# Patient Record
Sex: Male | Born: 1980 | Race: White | Hispanic: No | Marital: Married | Smoking: Never smoker
Health system: Southern US, Community
[De-identification: ages and names within clinical notes are randomized; demographics above are authoritative.]

---

## 2004-02-01 ENCOUNTER — Emergency Department (HOSPITAL_COMMUNITY): Admission: EM | Admit: 2004-02-01 | Discharge: 2004-02-01 | Payer: Self-pay | Admitting: Family Medicine

## 2005-02-02 ENCOUNTER — Emergency Department (HOSPITAL_COMMUNITY): Admission: EM | Admit: 2005-02-02 | Discharge: 2005-02-02 | Payer: Self-pay | Admitting: Family Medicine

## 2010-02-27 ENCOUNTER — Emergency Department (HOSPITAL_COMMUNITY): Admission: EM | Admit: 2010-02-27 | Discharge: 2010-02-27 | Payer: Self-pay | Admitting: Emergency Medicine

## 2015-05-25 ENCOUNTER — Emergency Department (HOSPITAL_COMMUNITY): Payer: Self-pay

## 2015-05-25 ENCOUNTER — Encounter (HOSPITAL_COMMUNITY): Payer: Self-pay | Admitting: Emergency Medicine

## 2015-05-25 ENCOUNTER — Emergency Department (HOSPITAL_COMMUNITY)
Admission: EM | Admit: 2015-05-25 | Discharge: 2015-05-25 | Disposition: A | Discharge status: Home / Self Care | Payer: Self-pay | Attending: Emergency Medicine | Admitting: Emergency Medicine

## 2015-05-25 DIAGNOSIS — Y998 Other external cause status: Secondary | ICD-10-CM | POA: Insufficient documentation

## 2015-05-25 DIAGNOSIS — Y9389 Activity, other specified: Secondary | ICD-10-CM | POA: Insufficient documentation

## 2015-05-25 DIAGNOSIS — M545 Low back pain, unspecified: Secondary | ICD-10-CM

## 2015-05-25 DIAGNOSIS — Y93F2 Activity, caregiving, lifting: Secondary | ICD-10-CM | POA: Insufficient documentation

## 2015-05-25 DIAGNOSIS — X58XXXA Exposure to other specified factors, initial encounter: Secondary | ICD-10-CM | POA: Insufficient documentation

## 2015-05-25 DIAGNOSIS — S3992XA Unspecified injury of lower back, initial encounter: Secondary | ICD-10-CM | POA: Insufficient documentation

## 2015-05-25 DIAGNOSIS — Y9289 Other specified places as the place of occurrence of the external cause: Secondary | ICD-10-CM | POA: Insufficient documentation

## 2015-05-25 MED ORDER — CYCLOBENZAPRINE HCL 10 MG PO TABS: 10.0000 mg | ORAL_TABLET | Freq: Two times a day (BID) | ORAL | Status: DC | PRN

## 2015-05-25 MED ORDER — OXYCODONE-ACETAMINOPHEN 5-325 MG PO TABS
1.0000 | ORAL_TABLET | Freq: Once | ORAL | Status: AC
  Administered 2015-05-25: 1 via ORAL
  Filled 2015-05-25: qty 1

## 2015-05-25 NOTE — ED Provider Notes (Signed)
CSN: [id number]     Arrival date & time 05/25/15  1130 History  This chart was scribed for Scott Mechanic, PA-C working with Donnetta Hutching, MD by Evon Slack, ED Scribe. This patient was seen in room TR11C/TR11C and the patient's care was started at 1:06 PM.    Chief Complaint  Patient presents with  . Back Pain   The history is provided by the patient. No language interpreter was used.   HPI Comments: Scott Lutz is a 34 y.o. male who presents to the Emergency Department complaining of low back pain onset 1 day prior. Pt states that he was lifting a refrigerator when he injured his back yesterday. Pt states that when he was lifting the fridge he heard a pop in the back and felt immediate pain. Pt states that the pain is worse with movement or standing. Pt denies fever, bowel/bladder incontinence, numbness, weakness. Pt reports previous back injury several years ago, he states he noticed relief with a muscle relaxer.   History reviewed. No pertinent past medical history. History reviewed. No pertinent past surgical history. No family history on file. Social History  Substance Use Topics  . Smoking status: Never Smoker   . Smokeless tobacco: None  . Alcohol Use: Yes     Comment: socially    Review of Systems  All other systems reviewed and are negative.    Allergies  Review of patient's allergies indicates no known allergies.  Home Medications   Prior to Admission medications   Medication Sig Start Date End Date Taking? Authorizing Provider  cyclobenzaprine (FLEXERIL) 10 MG tablet Take 1 tablet (10 mg total) by mouth 2 (two) times daily as needed for muscle spasms. 05/25/15   Dustine Stickler, PA-C   BP 120/90 mmHg  Pulse 64  Temp(Src) 98.2 F (36.8 C) (Oral)  Resp 16  SpO2 100%   Physical Exam  Constitutional: He is oriented to person, place, and time. He appears well-developed and well-nourished. No distress.  HENT:  Head: Normocephalic and atraumatic.  Eyes:  Conjunctivae and EOM are normal.  Neck: Neck supple. No tracheal deviation present.  Cardiovascular: Normal rate.   Pulmonary/Chest: Effort normal. No respiratory distress.  Musculoskeletal: Normal range of motion. He exhibits tenderness.  No C,T or L spine tenderness, tenderness to right para lumbar soft tissue, no obcious deformity, signs of infection, trauma or swelling. Distal extremities strength 5/5.  sensation grossly intact. cap refill less than 3 seconds.   Neurological: He is alert and oriented to person, place, and time.  Skin: Skin is warm and dry.  Psychiatric: He has a normal mood and affect. His behavior is normal.  Nursing note and vitals reviewed.   ED Course  Procedures (including critical care time)  Labs Review Labs Reviewed - No data to display  Imaging Review Dg Lumbar Spine 2-3 Views  05/25/2015   CLINICAL DATA:  Sudden sharp pains in the low back with movement. Pain began while moving a refrigerator yesterday. Initial encounter.  EXAM: LUMBAR SPINE - 2-3 VIEW  COMPARISON:  None.  FINDINGS: Five non rib-bearing lumbar type vertebral bodies are present. Vertebral body heights and alignment are maintained. There is some straightening of the normal lumbar lordosis. Soft tissues are unremarkable.  IMPRESSION: 1. No acute fracture or traumatic subluxation. 2. Straightening of the normal lumbar lordosis. This is nonspecific, but commonly seen in the setting of muscle strain or ongoing pain.   Electronically Signed   By: Marin Roberts M.D.   On:  05/25/2015 14:00   I have personally reviewed and evaluated these images and lab results as part of my medical decision-making.   EKG Interpretation None      MDM   Final diagnoses:  Right-sided low back pain without sciatica  Labs:  Imaging: DG Lumbar Spine - no significant findings  Consults:   Therapeutics: Percocet  Discharge Meds: Flexeril  Assessment/Plan: Patient presents with back pain, no red flags.  Plain films show no acute findings. Patient given Flexeril, symptomatic care instructions. Patient instructed to follow up with primary care if symptoms continue to persist, return immediately if any new worsening signs or symptoms present. Patient verbalized understanding and agreement for today's plan       I personally performed the services described in this documentation, which was scribed in my presence. The recorded information has been reviewed and is accurate.     Scott Mechanic, PA-C 05/25/15 1951  Donnetta Hutching, MD 05/26/15 [phone]

## 2015-05-25 NOTE — Discharge Instructions (Signed)
Please use medication only as directed. Please use ice, ibuprofen, Tylenol as needed for pain. Return immediately if new or worsening signs or symptoms present.

## 2015-05-25 NOTE — ED Notes (Signed)
Patient returned from xray.

## 2015-05-25 NOTE — ED Notes (Signed)
Patient states was lifting a refrigerator yesterday and felt a pop in lower back.  Patient denies loss of continence or symptoms other than lower back pain with radiation to R leg.

## 2015-11-19 ENCOUNTER — Emergency Department (HOSPITAL_COMMUNITY): Payer: Self-pay

## 2015-11-19 ENCOUNTER — Encounter (HOSPITAL_COMMUNITY): Payer: Self-pay | Admitting: Emergency Medicine

## 2015-11-19 ENCOUNTER — Emergency Department (HOSPITAL_COMMUNITY)
Admission: EM | Admit: 2015-11-19 | Discharge: 2015-11-20 | Disposition: A | Discharge status: Home / Self Care | Payer: Self-pay | Attending: Emergency Medicine | Admitting: Emergency Medicine

## 2015-11-19 DIAGNOSIS — Y998 Other external cause status: Secondary | ICD-10-CM | POA: Insufficient documentation

## 2015-11-19 DIAGNOSIS — Y9289 Other specified places as the place of occurrence of the external cause: Secondary | ICD-10-CM | POA: Insufficient documentation

## 2015-11-19 DIAGNOSIS — Y9389 Activity, other specified: Secondary | ICD-10-CM | POA: Insufficient documentation

## 2015-11-19 DIAGNOSIS — S060X1A Concussion with loss of consciousness of 30 minutes or less, initial encounter: Secondary | ICD-10-CM | POA: Insufficient documentation

## 2015-11-19 DIAGNOSIS — S0083XA Contusion of other part of head, initial encounter: Secondary | ICD-10-CM | POA: Insufficient documentation

## 2015-11-19 LAB — CBC
HEMATOCRIT: 48.1 % (ref 39.0–52.0)
HEMOGLOBIN: 16.3 g/dL (ref 13.0–17.0)
MCH: 30.7 pg (ref 26.0–34.0)
MCHC: 33.9 g/dL (ref 30.0–36.0)
MCV: 90.6 fL (ref 78.0–100.0)
Platelets: 296 10*3/uL (ref 150–400)
RBC: 5.31 MIL/uL (ref 4.22–5.81)
RDW: 13.4 % (ref 11.5–15.5)
WBC: 12.1 10*3/uL — AB (ref 4.0–10.5)

## 2015-11-19 LAB — COMPREHENSIVE METABOLIC PANEL
ALBUMIN: 4.3 g/dL (ref 3.5–5.0)
ALK PHOS: 47 U/L (ref 38–126)
ALT: 64 U/L — AB (ref 17–63)
AST: 43 U/L — ABNORMAL HIGH (ref 15–41)
Anion gap: 15 (ref 5–15)
BILIRUBIN TOTAL: 0.5 mg/dL (ref 0.3–1.2)
BUN: 8 mg/dL (ref 6–20)
CO2: 21 mmol/L — ABNORMAL LOW (ref 22–32)
Calcium: 9.4 mg/dL (ref 8.9–10.3)
Chloride: 105 mmol/L (ref 101–111)
Creatinine, Ser: 1.02 mg/dL (ref 0.61–1.24)
GFR calc non Af Amer: >60 mL/min (ref >60)
Glucose, Bld: 120 mg/dL — ABNORMAL HIGH (ref 65–99)
Potassium: 3.9 mmol/L (ref 3.5–5.1)
SODIUM: 141 mmol/L (ref 135–145)
TOTAL PROTEIN: 7.9 g/dL (ref 6.5–8.1)

## 2015-11-19 LAB — CDS SEROLOGY

## 2015-11-19 LAB — ETHANOL: Alcohol, Ethyl (B): 264 mg/dL — ABNORMAL HIGH (ref <5)

## 2015-11-19 LAB — PROTIME-INR
INR: 1 (ref 0.00–1.49)
Prothrombin Time: 13.4 seconds (ref 11.6–15.2)

## 2015-11-19 LAB — SAMPLE TO BLOOD BANK

## 2015-11-19 MED ORDER — SODIUM CHLORIDE 0.9 % IV BOLUS (SEPSIS)
500.0000 mL | Freq: Once | INTRAVENOUS | Status: AC
  Administered 2015-11-19: 500 mL via INTRAVENOUS

## 2015-11-19 MED ORDER — SODIUM CHLORIDE 0.9 % IV BOLUS (SEPSIS)
1000.0000 mL | Freq: Once | INTRAVENOUS | Status: AC
  Administered 2015-11-20: 1000 mL via INTRAVENOUS
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
## 2015-11-20 NOTE — ED Notes (Signed)
Pt ambulated out of ED, discharge instructions given to father.
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
## 2015-11-20 NOTE — ED Notes (Signed)
Assisted patient in ambulating to bathroom and getting dressed. Waiting on mom to pick him up.

## 2015-11-20 NOTE — ED Notes (Signed)
No belongings left at bedside

## 2015-11-21 ENCOUNTER — Encounter (HOSPITAL_COMMUNITY): Payer: Self-pay | Admitting: Emergency Medicine

## 2016-07-07 ENCOUNTER — Emergency Department (HOSPITAL_COMMUNITY): Payer: Self-pay

## 2016-07-07 ENCOUNTER — Emergency Department (HOSPITAL_COMMUNITY)
Admission: EM | Admit: 2016-07-07 | Discharge: 2016-07-08 | Disposition: A | Discharge status: Home / Self Care | Payer: Self-pay | Attending: Emergency Medicine | Admitting: Emergency Medicine

## 2016-07-07 ENCOUNTER — Encounter (HOSPITAL_COMMUNITY): Payer: Self-pay

## 2016-07-07 DIAGNOSIS — S59292A Other physeal fracture of lower end of radius, left arm, initial encounter for closed fracture: Secondary | ICD-10-CM | POA: Insufficient documentation

## 2016-07-07 DIAGNOSIS — Y9339 Activity, other involving climbing, rappelling and jumping off: Secondary | ICD-10-CM | POA: Insufficient documentation

## 2016-07-07 DIAGNOSIS — Y999 Unspecified external cause status: Secondary | ICD-10-CM | POA: Insufficient documentation

## 2016-07-07 DIAGNOSIS — Y929 Unspecified place or not applicable: Secondary | ICD-10-CM | POA: Insufficient documentation

## 2016-07-07 DIAGNOSIS — Z5181 Encounter for therapeutic drug level monitoring: Secondary | ICD-10-CM | POA: Insufficient documentation

## 2016-07-07 DIAGNOSIS — F1414 Cocaine abuse with cocaine-induced mood disorder: Secondary | ICD-10-CM | POA: Diagnosis present

## 2016-07-07 DIAGNOSIS — R45851 Suicidal ideations: Secondary | ICD-10-CM

## 2016-07-07 DIAGNOSIS — X58XXXA Exposure to other specified factors, initial encounter: Secondary | ICD-10-CM | POA: Insufficient documentation

## 2016-07-07 DIAGNOSIS — F1014 Alcohol abuse with alcohol-induced mood disorder: Secondary | ICD-10-CM | POA: Insufficient documentation

## 2016-07-07 DIAGNOSIS — F1410 Cocaine abuse, uncomplicated: Secondary | ICD-10-CM | POA: Insufficient documentation

## 2016-07-07 DIAGNOSIS — S52502A Unspecified fracture of the lower end of left radius, initial encounter for closed fracture: Secondary | ICD-10-CM

## 2016-07-07 LAB — CBC
HEMATOCRIT: 48.7 % (ref 39.0–52.0)
Hemoglobin: 16.4 g/dL (ref 13.0–17.0)
MCH: 30.6 pg (ref 26.0–34.0)
MCHC: 33.7 g/dL (ref 30.0–36.0)
MCV: 90.9 fL (ref 78.0–100.0)
PLATELETS: 330 10*3/uL (ref 150–400)
RBC: 5.36 MIL/uL (ref 4.22–5.81)
RDW: 13.5 % (ref 11.5–15.5)
WBC: 9.3 10*3/uL (ref 4.0–10.5)

## 2016-07-07 LAB — COMPREHENSIVE METABOLIC PANEL
ALK PHOS: 49 U/L (ref 38–126)
ALT: 59 U/L (ref 17–63)
AST: 29 U/L (ref 15–41)
Albumin: 5 g/dL (ref 3.5–5.0)
Anion gap: 11 (ref 5–15)
BILIRUBIN TOTAL: 0.7 mg/dL (ref 0.3–1.2)
BUN: 14 mg/dL (ref 6–20)
CALCIUM: 9.4 mg/dL (ref 8.9–10.3)
CO2: 25 mmol/L (ref 22–32)
CREATININE: 0.82 mg/dL (ref 0.61–1.24)
Chloride: 105 mmol/L (ref 101–111)
GFR calc Af Amer: >60 mL/min (ref >60)
Glucose, Bld: 100 mg/dL — ABNORMAL HIGH (ref 65–99)
Potassium: 4 mmol/L (ref 3.5–5.1)
Sodium: 141 mmol/L (ref 135–145)
TOTAL PROTEIN: 8.7 g/dL — AB (ref 6.5–8.1)

## 2016-07-07 LAB — RAPID URINE DRUG SCREEN, HOSP PERFORMED
AMPHETAMINES: NOT DETECTED
BARBITURATES: NOT DETECTED
Benzodiazepines: NOT DETECTED
Cocaine: POSITIVE — AB
Opiates: NOT DETECTED
Tetrahydrocannabinol: POSITIVE — AB

## 2016-07-07 LAB — ETHANOL: ALCOHOL ETHYL (B): 114 mg/dL — AB (ref <5)

## 2016-07-07 LAB — SALICYLATE LEVEL: Salicylate Lvl: <4 mg/dL (ref 2.8–30.0)

## 2016-07-07 LAB — ACETAMINOPHEN LEVEL: Acetaminophen (Tylenol), Serum: <10 ug/mL — ABNORMAL LOW (ref 10–30)

## 2016-07-07 MED ORDER — THIAMINE HCL 100 MG/ML IJ SOLN: 100.0000 mg | Freq: Every day | INTRAMUSCULAR | Status: DC

## 2016-07-07 MED ORDER — VITAMIN B-1 100 MG PO TABS
100.0000 mg | ORAL_TABLET | Freq: Every day | ORAL | Status: DC
  Administered 2016-07-07 – 2016-07-08 (×2): 100 mg via ORAL
  Filled 2016-07-07 (×2): qty 1

## 2016-07-07 MED ORDER — IBUPROFEN 800 MG PO TABS
800.0000 mg | ORAL_TABLET | Freq: Once | ORAL | Status: AC
  Administered 2016-07-07: 800 mg via ORAL
  Filled 2016-07-07: qty 1

## 2016-07-07 MED ORDER — ZOLPIDEM TARTRATE 5 MG PO TABS: 5.0000 mg | ORAL_TABLET | Freq: Every evening | ORAL | Status: DC | PRN

## 2016-07-07 MED ORDER — LORAZEPAM 1 MG PO TABS
0.0000 mg | ORAL_TABLET | Freq: Four times a day (QID) | ORAL | Status: DC
  Administered 2016-07-07: 1 mg via ORAL
  Filled 2016-07-07 (×2): qty 1

## 2016-07-07 MED ORDER — LORAZEPAM 1 MG PO TABS: 0.0000 mg | ORAL_TABLET | Freq: Two times a day (BID) | ORAL | Status: DC

## 2016-07-07 MED ORDER — ONDANSETRON HCL 4 MG PO TABS: 4.0000 mg | ORAL_TABLET | Freq: Three times a day (TID) | ORAL | Status: DC | PRN

## 2016-07-07 MED ORDER — IBUPROFEN 200 MG PO TABS
600.0000 mg | ORAL_TABLET | Freq: Three times a day (TID) | ORAL | Status: DC | PRN
  Administered 2016-07-07 – 2016-07-08 (×2): 600 mg via ORAL
  Filled 2016-07-07 (×2): qty 3

## 2016-07-07 NOTE — ED Notes (Signed)
Report given to [facility]
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
## 2016-07-07 NOTE — ED Notes (Signed)
Pt provided with scrubs by NT Tresa EndoKelly.

## 2016-07-07 NOTE — BH Assessment (Addendum)
Assessment Note  Scott Lutz is an 35 y.o. male presenting to [facility] voluntarily for suicidal ideations. Patient states that he thought that his arm was broken after a police chase last night. Patient states that he had a DUI and ran from the police and jumped a fence and hurt his arm. Patient states that he was released from jail and states "I just wanted to kill myself and I told the officer if they let me go I was just going to walk in front of a car or something." Patient states that police brought him into the ED for an evaluation and for a physical examination of his arm because he thought that it was broken. . Patient denies SI at this time but states that he has had recurring fleeting thoughts lately. Patient denies intent when asked but states "I would hang myself" as a plan. Patient denies that he has attempted in the past and that he has made any gestures towards this attempt. Patient states that he is not sure what he would use to hang himself but states " I just feel like it would be better iff I wasn't here." Patient states that his most recent stressor are multiple felony/misdemeanor charges for which he has court on July 10, 2016, in addition to his DUI. He states that due to the charges he lost his job, his car, and my lose his home. He states that his girlfriend also lost her job and there is no income for the home. Patient denies HI and history of aggression. Patient denies access to firearms. Patient denies AVH and does not appear to be responding to internal stimuli during the assessment. Patient states that he uses alcohol "about every other day" and last drank 8-9 drinks last night. Patient states that he uses about one gram of cocaine at "random" and last used .5 gram last night. Patient UDS +cocaine and + THC. Patient BAL 114.  Patient is alert and oriented x4 and is calm and cooperative during the assessment. Patient denies history of inpatient or outpatient psychiatric treatment.  Patient states that he has not been prescribed medication for depression or other psychiatric illness. Patient denies history of trauma/abuse.   Consulted with Nanine Means, DNP who recommends observation overnight and evaluation by psychiatry in the morning.    Diagnosis:  Cocaine use disorder, Severe                         Unspecified Depressive Disorder   Past Medical History: History reviewed. No pertinent past medical history.  History reviewed. No pertinent surgical history.  Family History: No family history on file.  Social History:  reports that he has never smoked. His smokeless tobacco use includes Chew. He reports that he drinks alcohol. He reports that he does not use drugs.  Additional Social History:  Alcohol / Drug Use Pain Medications: Denies Prescriptions: Denies Over the Counter: Denies History of alcohol / drug use?: Yes Substance #1 Name of Substance 1: Alcohol 1 - Age of First Use: 20 1 - Amount (size/oz): "about six a day" 1 - Frequency: "every other day" 1 - Duration: ongoing 1 - Last Use / Amount: last night 8-9 drinks Substance #2 Name of Substance 2: Cocaine 2 - Age of First Use: 26 2 - Amount (size/oz): a gram  2 - Frequency: "just random" 2 - Duration: ongoing 2 - Last Use / Amount: .5 gram  CIWA: CIWA-Ar BP: 121/75 Pulse Rate:  86 Nausea and Vomiting: no nausea and no vomiting Tactile Disturbances: none Tremor: two Auditory Disturbances: not present Paroxysmal Sweats: no sweat visible Visual Disturbances: not present Anxiety: three Headache, Fullness in Head: moderate Agitation: somewhat more than normal activity Orientation and Clouding of Sensorium: oriented and can do serial additions CIWA-Ar Total: 9 COWS:    Allergies: No Known Allergies  Home Medications:  (Not in a hospital admission)  OB/GYN Status:  No LMP for male patient.  General Assessment Data Location of Assessment: [facility] TTS Assessment: In system Is this a Tele  or Face-to-Face Assessment?: Face-to-Face Is this an Initial Assessment or a Re-assessment for this encounter?: Initial Assessment Marital status: Separated (two years) Is patient pregnant?: No Pregnancy Status: No Living Arrangements: Alone Can pt return to current living arrangement?: Yes Admission Status: Voluntary Is patient capable of signing voluntary admission?: Yes Referral Source: Self/Family/Friend     Crisis Care Plan Living Arrangements: Alone Name of Psychiatrist: None Name of Therapist: None  Education Status Is patient currently in school?: No Highest grade of school patient has completed: 12  Risk to self with the past 6 months Suicidal Ideation: Yes-Currently Present Has patient been a risk to self within the past 6 months prior to admission? : Yes Suicidal Intent: No Has patient had any suicidal intent within the past 6 months prior to admission? : No Is patient at risk for suicide?: Yes Suicidal Plan?: Yes-Currently Present Has patient had any suicidal plan within the past 6 months prior to admission? : No Specify Current Suicidal Plan: " I would hang mysel;f" Access to Means: Yes Specify Access to Suicidal Means: "i'm not sure" What has been your use of drugs/alcohol within the last 12 months?: Alcohol and cocaine Previous Attempts/Gestures: No How many times?: 0 Other Self Harm Risks: Denies Triggers for Past Attempts: None known Intentional Self Injurious Behavior: None Family Suicide History: No Recent stressful life event(s): Other (Comment) (girlfriend lost her job, Surveyor, quantity strain, lost car, house) Persecutory voices/beliefs?: No Depression: Yes Depression Symptoms: Insomnia, Tearfulness, Isolating, Fatigue, Loss of interest in usual pleasures, Feeling worthless/self pity, Feeling angry/irritable Substance abuse history and/or treatment for substance abuse?: Yes Suicide prevention information given to non-admitted patients: Not  applicable  Risk to Others within the past 6 months Homicidal Ideation: No Does patient have any lifetime risk of violence toward others beyond the six months prior to admission? : No Thoughts of Harm to Others: No Current Homicidal Intent: No Current Homicidal Plan: No Access to Homicidal Means: No Identified Victim: Denies History of harm to others?: No Assessment of Violence: None Noted Violent Behavior Description: Denies Does patient have access to weapons?: No Criminal Charges Pending?: Yes Describe Pending Criminal Charges: Assault, DWI, fleeing and alluding  Does patient have a court date: Yes Court Date: 07/10/16 (and 10/30) Is patient on probation?: No  Psychosis Hallucinations: None noted Delusions: None noted  Mental Status Report Appearance/Hygiene: In scrubs Eye Contact: Fair Motor Activity: Unremarkable Speech: Logical/coherent Level of Consciousness: Alert Mood: Pleasant Affect: Appropriate to circumstance Anxiety Level: None Thought Processes: Coherent, Relevant Judgement: Partial Orientation: Person, Place, Time, Situation, Appropriate for developmental age Obsessive Compulsive Thoughts/Behaviors: None  Cognitive Functioning Concentration: Normal Memory: Recent Intact, Remote Intact IQ: Average Insight: Fair Impulse Control: Fair Appetite: Fair Weight Loss: 10 (in past 30 days) Sleep: Decreased Total Hours of Sleep:  (4-5) Vegetative Symptoms: Not bathing  ADLScreening [facility] Assessment Services) Patient's cognitive ability adequate to safely complete daily activities?: Yes Patient able to express  need for assistance with ADLs?: Yes Independently performs ADLs?: Yes (appropriate for developmental age)  Prior Inpatient Therapy Prior Inpatient Therapy: No Prior Therapy Dates: N/A Prior Therapy Facilty/Provider(s): N/A Reason for Treatment: N/A  Prior Outpatient Therapy Prior Outpatient Therapy: No Prior Therapy Dates: N/A Prior Therapy  Facilty/Provider(s): N/A Reason for Treatment: N/A Does patient have an ACCT team?: No Does patient have Intensive In-House Services?  : No Does patient have [facility] services? : No Does patient have P4CC services?: No  ADL Screening (condition at time of admission) Patient's cognitive ability adequate to safely complete daily activities?: Yes Is the patient deaf or have difficulty hearing?: No Does the patient have difficulty seeing, even when wearing glasses/contacts?: No Does the patient have difficulty concentrating, remembering, or making decisions?: No Patient able to express need for assistance with ADLs?: Yes Does the patient have difficulty dressing or bathing?: No Independently performs ADLs?: Yes (appropriate for developmental age) Does the patient have difficulty walking or climbing stairs?: No Weakness of Legs: None Weakness of Arms/Hands: None  Home Assistive Devices/Equipment Home Assistive Devices/Equipment: None    Abuse/Neglect Assessment (Assessment to be complete while patient is alone) Physical Abuse: Denies Verbal Abuse: Denies Sexual Abuse: Denies Exploitation of patient/patient's resources: Denies Self-Neglect: Denies Values / Beliefs Cultural Requests During Hospitalization: None Spiritual Requests During Hospitalization: None   Advance Directives (For Healthcare) Does patient have an advance directive?: No Would patient like information on creating an advanced directive?: No - patient declined information    Additional Information 1:1 In Past 12 Months?: No CIRT Risk: No Elopement Risk: No Does patient have medical clearance?: Yes     Disposition:  Disposition Initial Assessment Completed for this Encounter: Yes Disposition of Patient: Other dispositions (observe overbight per Nanine MeansJamison Lord, DNP) Other disposition(s): Other (Comment)  On Site Evaluation by:   Reviewed with Physician:    Sahaj Bona 07/07/2016 12:51 PM

## 2016-07-07 NOTE — ED Notes (Addendum)
Pt changed into scrubs. Belongings and pt wanded by security

## 2016-07-07 NOTE — ED Provider Notes (Signed)
[facility] Provider Note   CSN: [id number] Arrival date & time: 07/07/16  [id number]     History   Chief Complaint Chief Complaint  Patient presents with  . Suicidal  . Arm Injury    HPI Scott Lutz is a 35 y.o. male.  HPI  Pt presenting with c/o feeling suicidal with plan to hang himself.  Pt states that it was his birthday yesterday and he did not get a call from anyone- he states he began to drink alcohol.  At one point he was running from police and climbed over a fence injuring his right arm/wrist.  He states he thought about running his car into something to kill himself but "chickened out".  Now he states he would hang himself.  Denies other drug use besides drinking etoh.  Denies recent illness.  There are no other associated systemic symptoms, there are no other alleviating or modifying factors.   History reviewed. No pertinent past medical history.  There are no active problems to display for this patient.   History reviewed. No pertinent surgical history.     Home Medications    Prior to Admission medications   Not on File    Family History No family history on file.  Social History Social History  Substance Use Topics  . Smoking status: Never Smoker  . Smokeless tobacco: Current User    Types: Chew  . Alcohol use Yes     Comment: socially     Allergies   Review of patient's allergies indicates no known allergies.   Review of Systems Review of Systems  ROS reviewed and all otherwise negative except for mentioned in HPI   Physical Exam Updated Vital Signs BP 121/75   Pulse 86   Temp 98.5 F (36.9 C)   Resp 20   Ht 5\' 9"  (1.753 m)   Wt 190 lb (86.2 kg)   SpO2 97%   BMI 28.06 kg/m  Vitals reviewed Physical Exam Physical Examination: General appearance - alert, well appearing, and in no distress Mental status - alert, oriented to person, place, and time Eyes - no conjunctival injection no scleral icterus Chest - clear to  auscultation, no wheezes, rales or rhonchi, symmetric air entry Heart - normal rate, regular rhythm, normal S1, S2, no murmurs, rubs, clicks or gallops Abdomen - soft, nontender, nondistended, no masses or organomegaly Back exam - full range of motion, no tenderness Neurological - alert, oriented, normal speech Musculoskeletal -left wrist with ttp over dorsum of wrist, distally NVI, otherwise  no joint tenderness, deformity or swelling Extremities - peripheral pulses normal, no pedal edema, no clubbing or cyanosis Skin - normal coloration and turgor, no rashes Psych- tearful, but cooperative  ED Treatments / Results  Labs (all labs ordered are listed, but only abnormal results are displayed) Labs Reviewed  COMPREHENSIVE METABOLIC PANEL - Abnormal; Notable for the following:       Result Value   Glucose, Bld 100 (*)    Total Protein 8.7 (*)    All other components within normal limits  ETHANOL - Abnormal; Notable for the following:    Alcohol, Ethyl (B) 114 (*)    All other components within normal limits  ACETAMINOPHEN LEVEL - Abnormal; Notable for the following:    Acetaminophen (Tylenol), Serum <10 (*)    All other components within normal limits  URINE RAPID DRUG SCREEN, HOSP PERFORMED - Abnormal; Notable for the following:    Cocaine POSITIVE (*)    Tetrahydrocannabinol POSITIVE (*)  All other components within normal limits  SALICYLATE LEVEL  CBC    EKG  EKG Interpretation None       Radiology Dg Wrist Complete Left  Result Date: 07/07/2016 CLINICAL DATA:  Pain after trauma. EXAM: LEFT WRIST - COMPLETE 3+ VIEW COMPARISON:  None. FINDINGS: There is a nondisplaced fracture through the distal radial metaphysis. No other abnormalities. IMPRESSION: Nondisplaced fracture through the distal radial metaphysis. Electronically Signed   By: Gerome Sam III M.D   On: 07/07/2016 08:26    Procedures Procedures (including critical care time)  Medications Ordered in  ED Medications  ibuprofen (ADVIL,MOTRIN) tablet 600 mg (not administered)  zolpidem (AMBIEN) tablet 5 mg (not administered)  ondansetron (ZOFRAN) tablet 4 mg (not administered)  LORazepam (ATIVAN) tablet 0-4 mg (0 mg Oral Hold 07/07/16 1220)    Followed by  LORazepam (ATIVAN) tablet 0-4 mg (not administered)  thiamine (VITAMIN B-1) tablet 100 mg (100 mg Oral Given 07/07/16 1132)    Or  thiamine (B-1) injection 100 mg ( Intravenous See Alternative 07/07/16 1132)  ibuprofen (ADVIL,MOTRIN) tablet 800 mg (800 mg Oral Given 07/07/16 0902)     Initial Impression / Assessment and Plan / ED Course  I have reviewed the triage vital signs and the nursing notes.  Pertinent labs & imaging results that were available during my care of the patient were reviewed by me and considered in my medical decision making (see chart for details).  Clinical Course  10:07 AM pt with nondisplaced distal radius fracture- this was splinted and will need ortho followup.  Pt is medically cleared at this time.    11:27 AM TTS has evaluated patient, they are going to keep patient overnight for observation.    Pt here with suicidal ideations- he does have a nondisplaced distal radius fracture- this is NVI- was placed in splint and given ibuprofen for pain control.  Pt will need to f/u with Dr. Merlyn Lot, hand surgery for casting.  Per TTS patient will be observed overnight.    Final Clinical Impressions(s) / ED Diagnoses   Final diagnoses:  Suicidal ideation  Distal radius fracture, left, closed, initial encounter    New Prescriptions New Prescriptions   No medications on file     Jerelyn Scott, MD 07/07/16 1621

## 2016-07-08 DIAGNOSIS — F1014 Alcohol abuse with alcohol-induced mood disorder: Secondary | ICD-10-CM | POA: Diagnosis present

## 2016-07-08 DIAGNOSIS — F17290 Nicotine dependence, other tobacco product, uncomplicated: Secondary | ICD-10-CM

## 2016-07-08 DIAGNOSIS — F1414 Cocaine abuse with cocaine-induced mood disorder: Secondary | ICD-10-CM | POA: Diagnosis present

## 2016-07-08 DIAGNOSIS — F1410 Cocaine abuse, uncomplicated: Secondary | ICD-10-CM | POA: Diagnosis present

## 2016-07-08 NOTE — ED Notes (Signed)
No respiratory or acute distress noted resting in bed with eyes closed hospital sitter with pt. 

## 2016-07-08 NOTE — ED Notes (Signed)
BP 144/106, ED provider notified. Discharge orders explained to pt, he verbalized understanding. Discharging home with mom.

## 2016-07-08 NOTE — ED Notes (Signed)
No respiratory or acute distress noted resting in bed with eyes closed hospital sitter at bedside.

## 2016-07-08 NOTE — Consult Note (Addendum)
[place] Psychiatry Consult   Reason for Consult:  Substance abuse with suicidal ideations Referring Physician:  EDP Patient Identification: Scott Lutz MRN:  [id number] Principal Diagnosis: Alcohol abuse with alcohol-induced mood disorder [facility]) Diagnosis:   Patient Active Problem List   Diagnosis Date Noted  . Alcohol abuse with alcohol-induced mood disorder ([place]) [F10.14] 07/08/2016    Priority: High  . Cocaine abuse [F14.10] 07/08/2016    Priority: High    Total Time spent with patient: 45 minutes  Subjective:   Scott Lutz is a 35 y.o. male patient does not warrant admission.  HPI:  35 yo male who presented to the ED with alcohol and cocaine abuse with suicidal ideations.  Last night he was drinking and using cocaine to celebrate to his birthday.  He was pulled over by the police and he ran.  In the process, he jumped a fence and hurt his arm.  Scott Lutz received a DWI and fleeing the scene charges.  Today, he is clear and coherent.  He is calmly watching television.  Denies suicidal/homicidal ideations, hallucinations, and withdrawal symptoms.  Stable for discharge with ADS resources.  Past Psychiatric History: alcohol and cocaine abuse  Risk to Self: Suicidal Ideation: Yes-Currently Present Suicidal Intent: No Is patient at risk for suicide?: Yes Suicidal Plan?: Yes-Currently Present Specify Current Suicidal Plan: " I would hang mysel;f" Access to Means: Yes Specify Access to Suicidal Means: "i'm not sure" What has been your use of drugs/alcohol within the last 12 months?: Alcohol and cocaine How many times?: 0 Other Self Harm Risks: Denies Triggers for Past Attempts: None known Intentional Self Injurious Behavior: None Risk to Others: Homicidal Ideation: No Thoughts of Harm to Others: No Current Homicidal Intent: No Current Homicidal Plan: No Access to Homicidal Means: No Identified Victim: Denies History of harm to others?: No Assessment of Violence:  None Noted Violent Behavior Description: Denies Does patient have access to weapons?: No Criminal Charges Pending?: Yes Describe Pending Criminal Charges: Assault, DWI, fleeing and alluding  Does patient have a court date: Yes Court Date: 07/10/16 (and 10/30) Prior Inpatient Therapy: Prior Inpatient Therapy: No Prior Therapy Dates: N/A Prior Therapy Facilty/Provider(s): N/A Reason for Treatment: N/A Prior Outpatient Therapy: Prior Outpatient Therapy: No Prior Therapy Dates: N/A Prior Therapy Facilty/Provider(s): N/A Reason for Treatment: N/A Does patient have an ACCT team?: No Does patient have Intensive In-House Services?  : No Does patient have [facility] services? : No Does patient have P4CC services?: No  Past Medical History: History reviewed. No pertinent past medical history. History reviewed. No pertinent surgical history. Family History: No family history on file. Family Psychiatric  History: none Social History:  History  Alcohol Use  . Yes    Comment: socially     History  Drug Use No    Social History   Social History  . Marital status: Married    Spouse name: N/A  . Number of children: N/A  . Years of education: N/A   Social History Main Topics  . Smoking status: Never Smoker  . Smokeless tobacco: Current User    Types: Chew  . Alcohol use Yes     Comment: socially  . Drug use: No  . Sexual activity: Not Asked   Other Topics Concern  . None   Social History Narrative   ** Merged History Encounter **       Additional Social History:    Allergies:  No Known Allergies  Labs:  Results for orders placed  or performed during the hospital encounter of 07/07/16 (from the past 48 hour(s))  Comprehensive metabolic panel     Status: Abnormal   Collection Time: 07/07/16  7:50 AM  Result Value Ref Range   Sodium 141 135 - 145 mmol/L   Potassium 4.0 3.5 - 5.1 mmol/L   Chloride 105 101 - 111 mmol/L   CO2 25 22 - 32 mmol/L   Glucose, Bld 100 (H) 65 - 99  mg/dL   BUN 14 6 - 20 mg/dL   Creatinine, Ser 0.82 0.61 - 1.24 mg/dL   Calcium 9.4 8.9 - 10.3 mg/dL   Total Protein 8.7 (H) 6.5 - 8.1 g/dL   Albumin 5.0 3.5 - 5.0 g/dL   AST 29 15 - 41 U/L   ALT 59 17 - 63 U/L   Alkaline Phosphatase 49 38 - 126 U/L   Total Bilirubin 0.7 0.3 - 1.2 mg/dL   GFR calc non Af Amer >60 >60 mL/min   GFR calc Af Amer >60 >60 mL/min    Comment: (NOTE) The eGFR has been calculated using the CKD EPI equation. This calculation has not been validated in all clinical situations. eGFR's persistently <60 mL/min signify possible Chronic Kidney Disease.    Anion gap 11 5 - 15  cbc     Status: None   Collection Time: 07/07/16  7:50 AM  Result Value Ref Range   WBC 9.3 4.0 - 10.5 K/uL   RBC 5.36 4.22 - 5.81 MIL/uL   Hemoglobin 16.4 13.0 - 17.0 g/dL   HCT 48.7 39.0 - 52.0 %   MCV 90.9 78.0 - 100.0 fL   MCH 30.6 26.0 - 34.0 pg   MCHC 33.7 30.0 - 36.0 g/dL   RDW 13.5 11.5 - 15.5 %   Platelets 330 150 - 400 K/uL  Rapid urine drug screen (hospital performed)     Status: Abnormal   Collection Time: 07/07/16  7:51 AM  Result Value Ref Range   Opiates NONE DETECTED NONE DETECTED   Cocaine POSITIVE (A) NONE DETECTED   Benzodiazepines NONE DETECTED NONE DETECTED   Amphetamines NONE DETECTED NONE DETECTED   Tetrahydrocannabinol POSITIVE (A) NONE DETECTED   Barbiturates NONE DETECTED NONE DETECTED    Comment:        DRUG SCREEN FOR MEDICAL PURPOSES ONLY.  IF CONFIRMATION IS NEEDED FOR ANY PURPOSE, NOTIFY LAB WITHIN 5 DAYS.        LOWEST DETECTABLE LIMITS FOR URINE DRUG SCREEN Drug Class       Cutoff (ng/mL) Amphetamine      1000 Barbiturate      200 Benzodiazepine   626 Tricyclics       948 Opiates          300 Cocaine          300 THC              50   Ethanol     Status: Abnormal   Collection Time: 07/07/16  7:52 AM  Result Value Ref Range   Alcohol, Ethyl (B) 114 (H) <5 mg/dL    Comment:        LOWEST DETECTABLE LIMIT FOR SERUM ALCOHOL IS 5  mg/dL FOR MEDICAL PURPOSES ONLY   Salicylate level     Status: None   Collection Time: 07/07/16  7:52 AM  Result Value Ref Range   Salicylate Lvl <5.4 2.8 - 30.0 mg/dL  Acetaminophen level     Status: Abnormal   Collection Time: 07/07/16  7:52  AM  Result Value Ref Range   Acetaminophen (Tylenol), Serum <10 (L) 10 - 30 ug/mL    Comment:        THERAPEUTIC CONCENTRATIONS VARY SIGNIFICANTLY. A RANGE OF 10-30 ug/mL MAY BE AN EFFECTIVE CONCENTRATION FOR MANY PATIENTS. HOWEVER, SOME ARE BEST TREATED AT CONCENTRATIONS OUTSIDE THIS RANGE. ACETAMINOPHEN CONCENTRATIONS >150 ug/mL AT 4 HOURS AFTER INGESTION AND >50 ug/mL AT 12 HOURS AFTER INGESTION ARE OFTEN ASSOCIATED WITH TOXIC REACTIONS.     Current Facility-Administered Medications  Medication Dose Route Frequency Provider Last Rate Last Dose  . ibuprofen (ADVIL,MOTRIN) tablet 600 mg  600 mg Oral Q8H PRN Alfonzo Beers, MD   600 mg at 07/08/16 0904  . LORazepam (ATIVAN) tablet 0-4 mg  0-4 mg Oral Q6H Alfonzo Beers, MD   1 mg at 07/07/16 1823   Followed by  . [START ON 07/09/2016] LORazepam (ATIVAN) tablet 0-4 mg  0-4 mg Oral Q12H Alfonzo Beers, MD      . ondansetron [facility]) tablet 4 mg  4 mg Oral Q8H PRN Alfonzo Beers, MD      . thiamine (VITAMIN B-1) tablet 100 mg  100 mg Oral Daily Alfonzo Beers, MD   100 mg at 07/08/16 7654   Or  . thiamine (B-1) injection 100 mg  100 mg Intravenous Daily Alfonzo Beers, MD      . zolpidem (AMBIEN) tablet 5 mg  5 mg Oral QHS PRN Alfonzo Beers, MD       No current outpatient prescriptions on file.    Musculoskeletal: Strength & Muscle Tone: within normal limits Gait & Station: normal Patient leans: N/A  Psychiatric Specialty Exam: Physical Exam  Constitutional: He is oriented to person, place, and time. He appears well-developed and well-nourished.  HENT:  Head: Normocephalic.  Neck: Normal range of motion.  Respiratory: Effort normal.  Musculoskeletal: Normal range of motion.   Neurological: He is alert and oriented to person, place, and time.  Skin: Skin is warm and dry.  Psychiatric: He has a normal mood and affect. His speech is normal and behavior is normal. Judgment and thought content normal. Cognition and memory are normal.    Review of Systems  Constitutional: Negative.   HENT: Negative.   Eyes: Negative.   Respiratory: Negative.   Cardiovascular: Negative.   Gastrointestinal: Negative.   Genitourinary: Negative.   Musculoskeletal: Negative.   Skin: Negative.   Neurological: Negative.   Endo/Heme/Allergies: Negative.   Psychiatric/Behavioral: Positive for substance abuse.    Blood pressure 126/78, pulse 75, temperature 97.8 F (36.6 C), temperature source Oral, resp. rate 18, height 5' 9"  (1.753 m), weight 86.2 kg (190 lb), SpO2 96 %.Body mass index is 28.06 kg/m.  General Appearance: Casual  Eye Contact:  Good  Speech:  Normal Rate  Volume:  Normal  Mood:  Euthymic  Affect:  Congruent  Thought Process:  Coherent and Descriptions of Associations: Intact  Orientation:  Full (Time, Place, and Person)  Thought Content:  WDL  Suicidal Thoughts:  No  Homicidal Thoughts:  No  Memory:  Immediate;   Good Recent;   Good Remote;   Good  Judgement:  Fair  Insight:  Fair  Psychomotor Activity:  Normal  Concentration:  Concentration: Good and Attention Span: Good  Recall:  Good  Fund of Knowledge:  Fair  Language:  Good  Akathisia:  No  Handed:  Right  AIMS (if indicated):     Assets:  Leisure Time Physical Health Resilience Social Support  ADL's:  Intact  Cognition:  WNL  Sleep:        Treatment Plan Summary: Daily contact with patient to assess and evaluate symptoms and progress in treatment, Medication management and Plan alcohol abuse with alcohol induced mood disorder:  -Crisis stabilization -Medication management:  Ativan alcohol detox protocol started. -Individual counseling  Disposition: No evidence of imminent risk to self  or others at present.    Scott Boga, NP 07/08/2016 10:17 AM   Patient seen face to face for this psych evaluation along with physician extender, case discussed with treatment team and formulated treatment plan. Patient denied current suicide or homicide ideation, and has normal mental status since become sober during this visit. Reviewed the information documented and agree with the treatment plan.  Scott Lutz 07/09/2016 11:42 AM

## 2016-07-08 NOTE — [facility] Suicide Risk Assessment (Signed)
Suicide Risk Assessment  Discharge Assessment   [facility]   Principal Problem: Alcohol abuse with alcohol-induced mood disorder [facility](HCC) Discharge Diagnoses:  Patient Active Problem List   Diagnosis Date Noted  . Alcohol abuse with alcohol-induced mood disorder (HCC) [F10.14] 07/08/2016    Priority: High  . Cocaine abuse [F14.10] 07/08/2016    Priority: High    Total Time spent with patient: 45 minutes  Musculoskeletal: Strength & Muscle Tone: within normal limits Gait & Station: normal Patient leans: N/A  Psychiatric Specialty Exam: Physical Exam  Constitutional: He is oriented to person, place, and time. He appears well-developed and well-nourished.  HENT:  Head: Normocephalic.  Neck: Normal range of motion.  Respiratory: Effort normal.  Musculoskeletal: Normal range of motion.  Neurological: He is alert and oriented to person, place, and time.  Skin: Skin is warm and dry.  Psychiatric: He has a normal mood and affect. His speech is normal and behavior is normal. Judgment and thought content normal. Cognition and memory are normal.    Review of Systems  Constitutional: Negative.   HENT: Negative.   Eyes: Negative.   Respiratory: Negative.   Cardiovascular: Negative.   Gastrointestinal: Negative.   Genitourinary: Negative.   Musculoskeletal: Negative.   Skin: Negative.   Neurological: Negative.   Endo/Heme/Allergies: Negative.   Psychiatric/Behavioral: Positive for substance abuse.    Blood pressure 126/78, pulse 75, temperature 97.8 F (36.6 C), temperature source Oral, resp. rate 18, height 5\' 9"  (1.753 m), weight 86.2 kg (190 lb), SpO2 96 %.Body mass index is 28.06 kg/m.  General Appearance: Casual  Eye Contact:  Good  Speech:  Normal Rate  Volume:  Normal  Mood:  Euthymic  Affect:  Congruent  Thought Process:  Coherent and Descriptions of Associations: Intact  Orientation:  Full (Time, Place, and Person)  Thought Content:  WDL   Suicidal Thoughts:  No  Homicidal Thoughts:  No  Memory:  Immediate;   Good Recent;   Good Remote;   Good  Judgement:  Fair  Insight:  Fair  Psychomotor Activity:  Normal  Concentration:  Concentration: Good and Attention Span: Good  Recall:  Good  Fund of Knowledge:  Fair  Language:  Good  Akathisia:  No  Handed:  Right  AIMS (if indicated):     Assets:  Leisure Time Physical Health Resilience Social Support  ADL's:  Intact  Cognition:  WNL  Sleep:      Mental Status Per Nursing Assessment::   On Admission:   alcohol and cocaine abuse with suicidal ideations  Demographic Factors:  Male and Caucasian  Loss Factors: Legal issues  Historical Factors: NA  Risk Reduction Factors:   Sense of responsibility to family, Living with another person, especially a relative and Positive social support  Continued Clinical Symptoms:  None   Cognitive Features That Contribute To Risk:  None    Suicide Risk:  Minimal: No identifiable suicidal ideation.  Patients presenting with no risk factors but with morbid ruminations; may be classified as minimal risk based on the severity of the depressive symptoms    Plan Of Care/Follow-up recommendations:  Activity:  as tolerated Diet:  heart healthy diet  Kadence Mimbs, NP 07/08/2016, 10:25 AM

## 2017-02-02 ENCOUNTER — Emergency Department (HOSPITAL_COMMUNITY): Payer: Self-pay

## 2017-02-02 ENCOUNTER — Encounter (HOSPITAL_COMMUNITY): Payer: Self-pay

## 2017-02-02 ENCOUNTER — Emergency Department (HOSPITAL_COMMUNITY)
Admission: EM | Admit: 2017-02-02 | Discharge: 2017-02-02 | Disposition: A | Discharge status: Home / Self Care | Payer: Self-pay | Attending: Emergency Medicine | Admitting: Emergency Medicine

## 2017-02-02 DIAGNOSIS — F10920 Alcohol use, unspecified with intoxication, uncomplicated: Secondary | ICD-10-CM

## 2017-02-02 DIAGNOSIS — Z5181 Encounter for therapeutic drug level monitoring: Secondary | ICD-10-CM | POA: Insufficient documentation

## 2017-02-02 DIAGNOSIS — R930 Abnormal findings on diagnostic imaging of skull and head, not elsewhere classified: Secondary | ICD-10-CM | POA: Insufficient documentation

## 2017-02-02 DIAGNOSIS — F10129 Alcohol abuse with intoxication, unspecified: Secondary | ICD-10-CM | POA: Insufficient documentation

## 2017-02-02 LAB — CBC WITH DIFFERENTIAL/PLATELET
Basophils Absolute: 0 10*3/uL (ref 0.0–0.1)
Basophils Relative: 0 %
EOS ABS: 0.3 10*3/uL (ref 0.0–0.7)
Eosinophils Relative: 3 %
HCT: 49.3 % (ref 39.0–52.0)
HEMOGLOBIN: 16.4 g/dL (ref 13.0–17.0)
LYMPHS ABS: 3.6 10*3/uL (ref 0.7–4.0)
Lymphocytes Relative: 34 %
MCH: 30.8 pg (ref 26.0–34.0)
MCHC: 33.3 g/dL (ref 30.0–36.0)
MCV: 92.7 fL (ref 78.0–100.0)
MONOS PCT: 6 %
Monocytes Absolute: 0.7 10*3/uL (ref 0.1–1.0)
Neutro Abs: 6.1 10*3/uL (ref 1.7–7.7)
Neutrophils Relative %: 57 %
Platelets: 379 10*3/uL (ref 150–400)
RBC: 5.32 MIL/uL (ref 4.22–5.81)
RDW: 14.1 % (ref 11.5–15.5)
WBC: 10.7 10*3/uL — ABNORMAL HIGH (ref 4.0–10.5)

## 2017-02-02 LAB — BASIC METABOLIC PANEL
ANION GAP: 11 (ref 5–15)
BUN: 7 mg/dL (ref 6–20)
CHLORIDE: 103 mmol/L (ref 101–111)
CO2: 25 mmol/L (ref 22–32)
Calcium: 8.3 mg/dL — ABNORMAL LOW (ref 8.9–10.3)
Creatinine, Ser: 0.95 mg/dL (ref 0.61–1.24)
GFR calc Af Amer: >60 mL/min (ref >60)
GFR calc non Af Amer: >60 mL/min (ref >60)
GLUCOSE: 124 mg/dL — AB (ref 65–99)
Potassium: 4.5 mmol/L (ref 3.5–5.1)
Sodium: 139 mmol/L (ref 135–145)

## 2017-02-02 LAB — RAPID URINE DRUG SCREEN, HOSP PERFORMED
AMPHETAMINES: NOT DETECTED
Barbiturates: NOT DETECTED
Benzodiazepines: NOT DETECTED
Cocaine: POSITIVE — AB
Opiates: NOT DETECTED
Tetrahydrocannabinol: NOT DETECTED

## 2017-02-02 LAB — ETHANOL: ALCOHOL ETHYL (B): 381 mg/dL — AB (ref <5)

## 2017-02-02 NOTE — Discharge Instructions (Signed)
Read the information below.  You may return to the Emergency Department at any time for worsening condition or any new symptoms that concern you. °

## 2017-02-02 NOTE — ED Notes (Signed)
Per MD order pt sat up in bed, put on shoes, ambulated from [place] Bed C to Rm 20. Pt c/o feeling dizzy and headache. Pt assisted back to [facility] C, able to lay down in bed on back w/o assistance. RN advised. ENM

## 2017-02-02 NOTE — ED Provider Notes (Signed)
[facility]-EMERGENCY DEPT Provider Note   CSN: [id number] Arrival date & time: 02/02/17  0109  By signing my name below, I, Diona Browner, attest that this documentation has been prepared under the direction and in the presence of [facility], PA-C.  Electronically Signed: Diona Browner, ED Scribe. 02/02/17. 7:16 AM.  LEVEL V CAVEAT: HPI and ROS limited due to ETOH intoxication.  History   Chief Complaint Chief Complaint  Patient presents with  . Alcohol Intoxication    HPI Scott Lutz is a 36 y.o. male BIB EMS who presents to the Emergency Department complaining of ETOH intoxication. Pt was highly intoxicated when found on the side of the road and was unable to ambulate. He told EMS he also did some cocaine. CBG 122. Pt was given 4 mg of Zofran and was given 500 cc of NS en route. Pt has a bad cough and is snoring.   The history is provided by the patient. The history is limited by the condition of the patient. No language interpreter was used.    History reviewed. No pertinent past medical history.  Patient Active Problem List   Diagnosis Date Noted  . Alcohol abuse with alcohol-induced mood disorder (HCC) 07/08/2016  . Cocaine abuse 07/08/2016    History reviewed. No pertinent surgical history.     Home Medications    Prior to Admission medications   Not on File    Family History History reviewed. No pertinent family history.  Social History Social History  Substance Use Topics  . Smoking status: Never Smoker  . Smokeless tobacco: Current User    Types: Chew  . Alcohol use Yes     Comment: socially     Allergies   Patient has no known allergies.   Review of Systems Review of Systems  Reason unable to perform ROS: ETOH intoxication.     Physical Exam Updated Vital Signs There were no vitals taken for this visit.  Physical Exam  Constitutional: He appears well-developed and well-nourished. No distress.  Snoring and coughing.   HENT:  Head:  Normocephalic and atraumatic.  Neck: Neck supple.  Cardiovascular: Normal rate and regular rhythm.   Pulmonary/Chest: Effort normal and breath sounds normal. No respiratory distress. He has no wheezes. He has no rales.  Nasal discharge  Abdominal: Soft. He exhibits no distension and no mass. There is no tenderness. There is no rebound and no guarding.  Neurological: He is alert. He exhibits normal muscle tone.  Skin: He is not diaphoretic.  Nursing note and vitals reviewed.    ED Treatments / Results  DIAGNOSTIC STUDIES: Oxygen Saturation is 100% on RA, normal by my interpretation.   COORDINATION OF CARE: 1:41 AM-Discussed next steps with pt. Pt verbalized understanding and is agreeable with the plan.    Labs (all labs ordered are listed, but only abnormal results are displayed) Labs Reviewed  BASIC METABOLIC PANEL - Abnormal; Notable for the following:       Result Value   Glucose, Bld 124 (*)    Calcium 8.3 (*)    All other components within normal limits  CBC WITH DIFFERENTIAL/PLATELET - Abnormal; Notable for the following:    WBC 10.7 (*)    All other components within normal limits  ETHANOL - Abnormal; Notable for the following:    Alcohol, Ethyl (B) 381 (*)    All other components within normal limits  RAPID URINE DRUG SCREEN, HOSP PERFORMED - Abnormal; Notable for the following:    Cocaine POSITIVE (*)  All other components within normal limits    EKG  EKG Interpretation None       Radiology Dg Chest 2 View  Result Date: 02/02/2017 CLINICAL DATA:  36 year old male with cough EXAM: CHEST  2 VIEW COMPARISON:  Chest radiograph dated 11/19/2015 FINDINGS: There is shallow inspiration. Minimal left lung base atelectatic changes. No focal consolidation, pleural effusion, or pneumothorax. The cardiac silhouette is within normal limits. No acute osseous pathology. IMPRESSION: Shallow inspiration.  No acute cardiopulmonary process. Electronically Signed   By: Elgie Collard M.D.   On: 02/02/2017 02:31   Ct Head Wo Contrast  Result Date: 02/02/2017 CLINICAL DATA:  36 year old male with intoxication and cough. EXAM: CT HEAD WITHOUT CONTRAST CT CERVICAL SPINE WITHOUT CONTRAST TECHNIQUE: Multidetector CT imaging of the head and cervical spine was performed following the standard protocol without intravenous contrast. Multiplanar CT image reconstructions of the cervical spine were also generated. COMPARISON:  Head CT dated 11/19/2015 FINDINGS: CT HEAD FINDINGS Brain: No evidence of acute infarction, hemorrhage, hydrocephalus, extra-axial collection or mass lesion/mass effect. Vascular: No hyperdense vessel or unexpected calcification. Skull: Normal. Negative for fracture or focal lesion. Sinuses/Orbits: There is diffuse mucoperiosteal thickening of paranasal sinuses. No air-fluid levels. The mastoid air cells are clear. Other: None CT CERVICAL SPINE FINDINGS Evaluation of cervical spine is limited due to motion artifact. The sagittal images are severely motion degraded. Alignment: No definite acute subluxation. There is apparent reversal of normal cervical lordosis which may be artifactual or positional or due to muscle spasm. Skull base and vertebrae: No definite acute fracture. Soft tissues and spinal canal: No prevertebral fluid or swelling. No visible canal hematoma. Disc levels: No definite acute findings. Evaluation however is limited due to motion artifact. Upper chest: Negative. Other: None IMPRESSION: 1. No acute intracranial pathology. 2. No definite acute/traumatic cervical spine pathology. Electronically Signed   By: Elgie Collard M.D.   On: 02/02/2017 02:58   Ct Cervical Spine Wo Contrast  Result Date: 02/02/2017 CLINICAL DATA:  36 year old male with intoxication and cough. EXAM: CT HEAD WITHOUT CONTRAST CT CERVICAL SPINE WITHOUT CONTRAST TECHNIQUE: Multidetector CT imaging of the head and cervical spine was performed following the standard protocol without  intravenous contrast. Multiplanar CT image reconstructions of the cervical spine were also generated. COMPARISON:  Head CT dated 11/19/2015 FINDINGS: CT HEAD FINDINGS Brain: No evidence of acute infarction, hemorrhage, hydrocephalus, extra-axial collection or mass lesion/mass effect. Vascular: No hyperdense vessel or unexpected calcification. Skull: Normal. Negative for fracture or focal lesion. Sinuses/Orbits: There is diffuse mucoperiosteal thickening of paranasal sinuses. No air-fluid levels. The mastoid air cells are clear. Other: None CT CERVICAL SPINE FINDINGS Evaluation of cervical spine is limited due to motion artifact. The sagittal images are severely motion degraded. Alignment: No definite acute subluxation. There is apparent reversal of normal cervical lordosis which may be artifactual or positional or due to muscle spasm. Skull base and vertebrae: No definite acute fracture. Soft tissues and spinal canal: No prevertebral fluid or swelling. No visible canal hematoma. Disc levels: No definite acute findings. Evaluation however is limited due to motion artifact. Upper chest: Negative. Other: None IMPRESSION: 1. No acute intracranial pathology. 2. No definite acute/traumatic cervical spine pathology. Electronically Signed   By: Elgie Collard M.D.   On: 02/02/2017 02:58    Procedures Procedures (including critical care time)  Medications Ordered in ED Medications - No data to display   Initial Impression / Assessment and Plan / ED Course  I have reviewed the triage  vital signs and the nursing notes.  Pertinent labs & imaging results that were available during my care of the patient were reviewed by me and considered in my medical decision making (see chart for details).  Clinical Course as of Feb 03 715  Sat Feb 02, 2017  0611 Pt wakes up more easily now and speaks more clearly.  Goes back to sleep quickly.    [EW]    Clinical Course User Index [EW] Trixie Dredge, PA-C    Pt brought  to ED after fall, intoxicated.  Slowly waking up. CTs, CXR negative.  Patient signed out to [facility], PA-C, at change of shift pending sobering and reassessment.  Anticipate discharge home.    Final Clinical Impressions(s) / ED Diagnoses   Final diagnoses:  Alcoholic intoxication without complication (HCC)    New Prescriptions New Prescriptions   No medications on file   I personally performed the services described in this documentation, which was scribed in my presence. The recorded information has been reviewed and is accurate.     Trixie Dredge, PA-C 02/02/17 1610    Gilda Crease, MD 02/03/17 [phone]

## 2017-02-02 NOTE — ED Notes (Signed)
Pt ate his breakfast

## 2017-02-02 NOTE — ED Notes (Signed)
Pt sleeping. 

## 2017-02-02 NOTE — ED Triage Notes (Signed)
Pt found by EMS who stated pt was found in the side of the road, by bystanders. Pt highly intoxicated and was unable to ambulate. Pt report to EMS that he had been drinking and did some cocaine as well. CBG 122, Pt was given  of Zofran and was given 500 cc of NS enroute. Pt had 2 knives on his possession when found by EMS who reports  that [employer] confiscated weapons.

## 2017-02-02 NOTE — ED Provider Notes (Signed)
  Physical Exam  BP 108/63 (BP Location: Left Arm)   Pulse (!) 101   Resp 18   SpO2 91%   Physical Exam  Constitutional: He appears well-developed and well-nourished. No distress.  HENT:  Head: Normocephalic and atraumatic.  Nose: Nose normal.  Eyes: Conjunctivae and EOM are normal. Right eye exhibits no discharge. Left eye exhibits no discharge. No scleral icterus.  Neck: Normal range of motion. Neck supple.  Cardiovascular: Normal rate, regular rhythm and normal heart sounds.   Pulmonary/Chest: Effort normal and breath sounds normal. No respiratory distress.  Abdominal: Soft. Bowel sounds are normal.  Musculoskeletal: Normal range of motion. He exhibits no edema.  Neurological: He is alert. He exhibits normal muscle tone. Coordination normal.  Skin: Skin is warm and dry. No rash noted. He is not diaphoretic.  Psychiatric: He has a normal mood and affect.  Nursing note and vitals reviewed.   ED Course  Procedures  MDM Sign out received from [facility], [place]. Patient awake and ambulatory in hall. Ate breakfast with no problems. Patient c/o headache at this time but was reassured that all imaging returned as normal. Denies any SI, HI. States he is ready to go home. Mother was contacted who states she will pick him up at discharge.        Dietrich Pates, PA-C 02/02/17 1716

## 2017-07-08 DEATH — deceased

## 2018-06-30 IMAGING — CT CT HEAD W/O CM
4 of 9 series · 15 of 47 positions shown, 18 images · non-contrast
Comparison: Head CT dated 11/19/2015

CLINICAL DATA: 35-year-old male with intoxication and cough.

EXAM:
CT HEAD WITHOUT CONTRAST
CT CERVICAL SPINE WITHOUT CONTRAST
TECHNIQUE: Multidetector CT imaging of the head and cervical spine was
performed following the standard protocol without intravenous
contrast. Multiplanar CT image reconstructions of the cervical spine
were also generated.

[Series 4: bone windows · axial · 0.43mm/px · z∈[-130,-73]mm · 3 of 48 slices shown]
[im 10/48  bone]
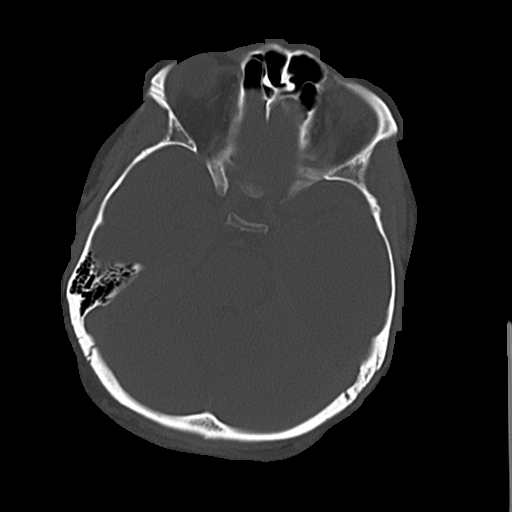
[im 19/48  bone]
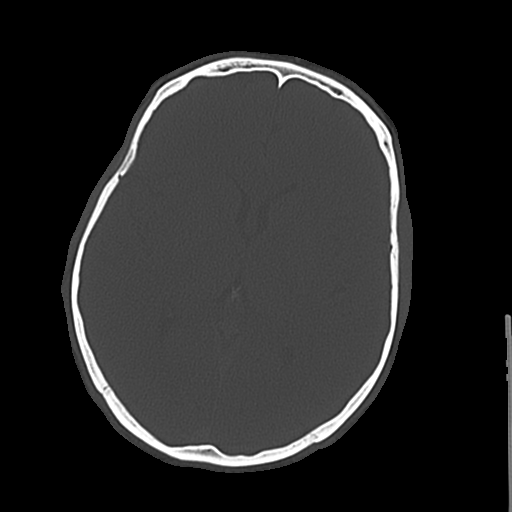
[im 29/48  bone]
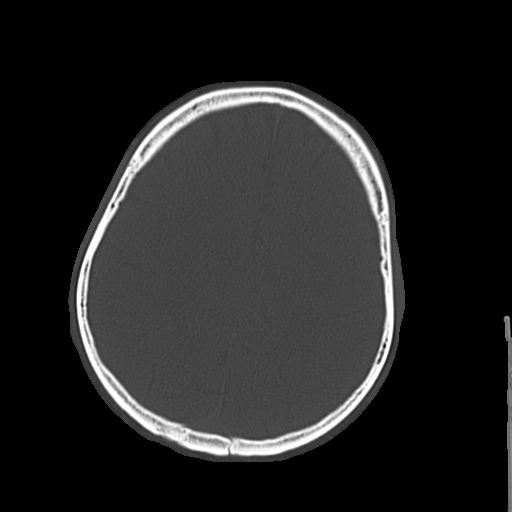

[Series 11: axial recon · axial · 0.23mm/px · z∈[-342,-211]mm · 9 of 93 slices shown, 12 images]
[im 10/93  brain]
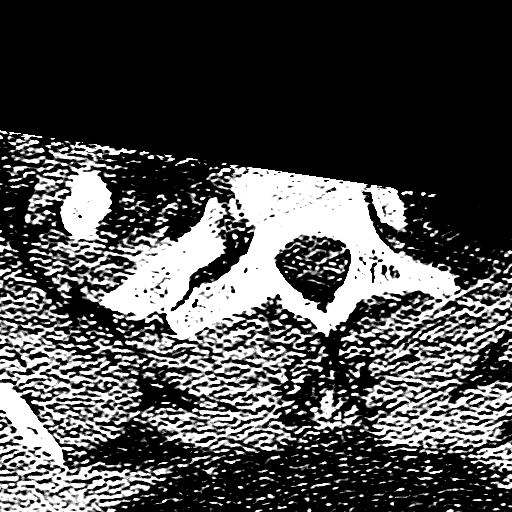
[im 10/93  bone]
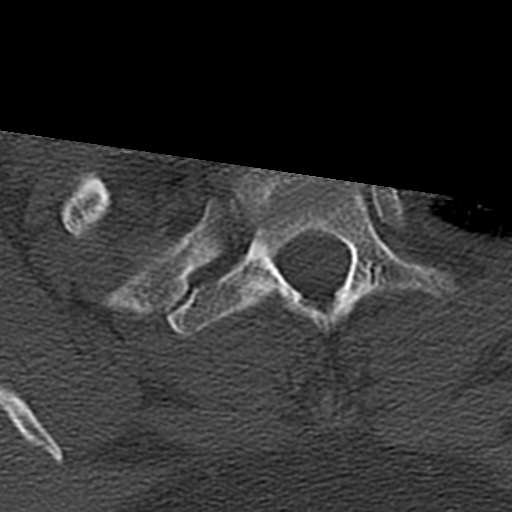
[im 19/93  brain]
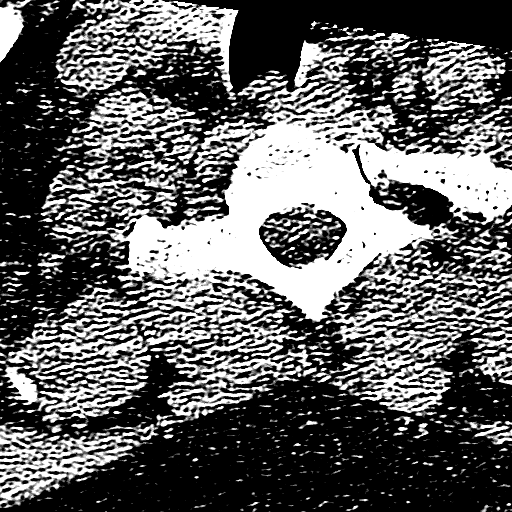
[im 28/93  brain]
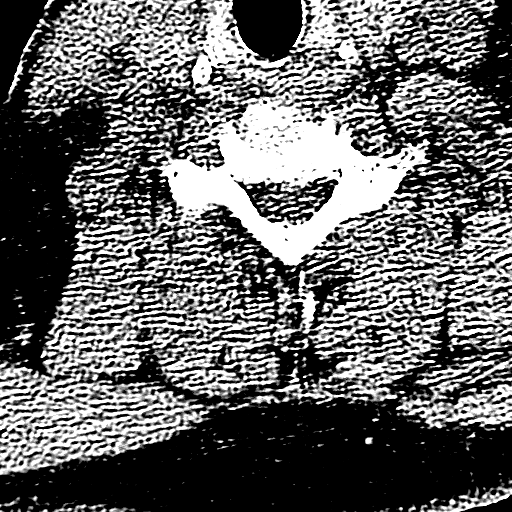
[im 37/93  brain]
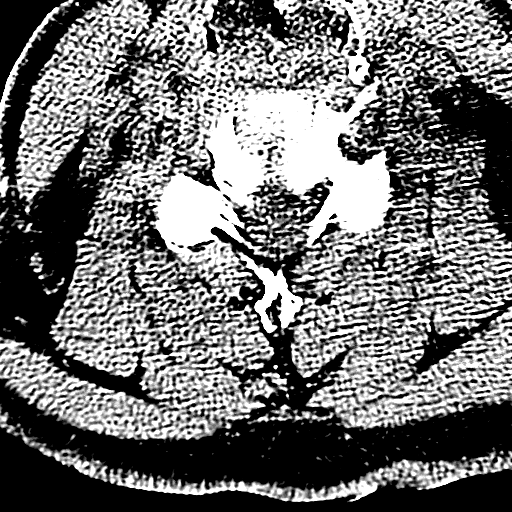
[im 47/93  brain]
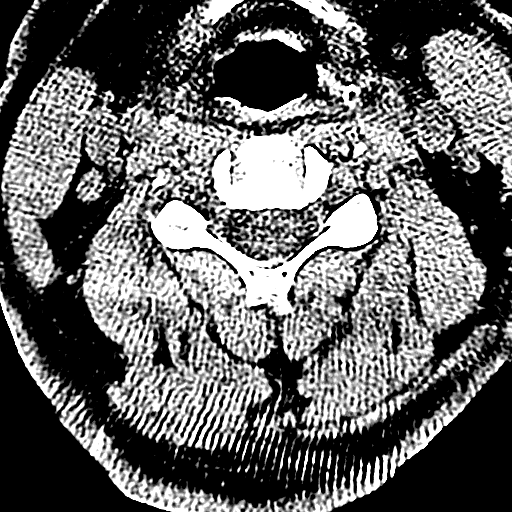
[im 47/93  bone]
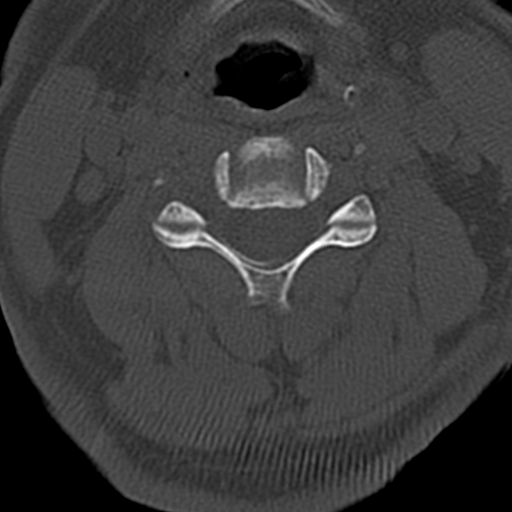
[im 56/93  brain]
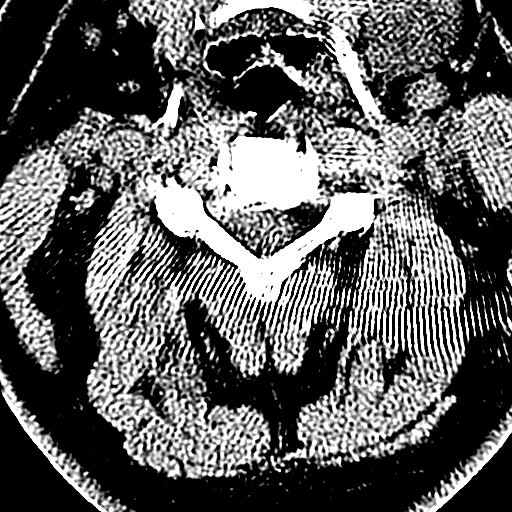
[im 65/93  brain]
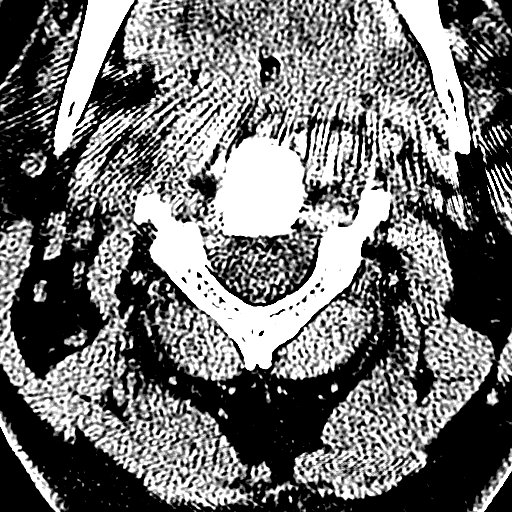
[im 74/93  brain]
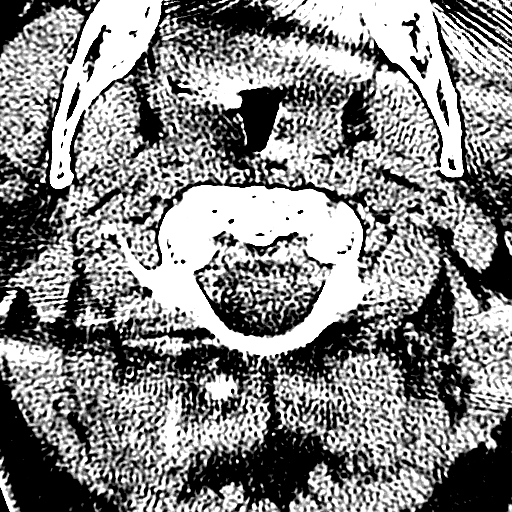
[im 83/93  brain]
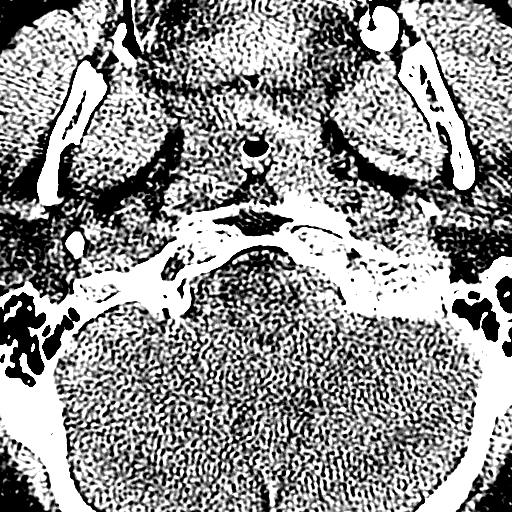
[im 83/93  bone]
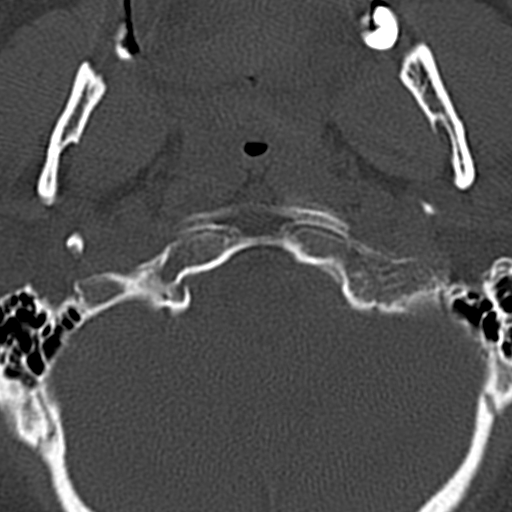

[Series 12: coronal · coronal · 0.27mm/px · 2 of 42 slices shown]
[im 14/42  brain]
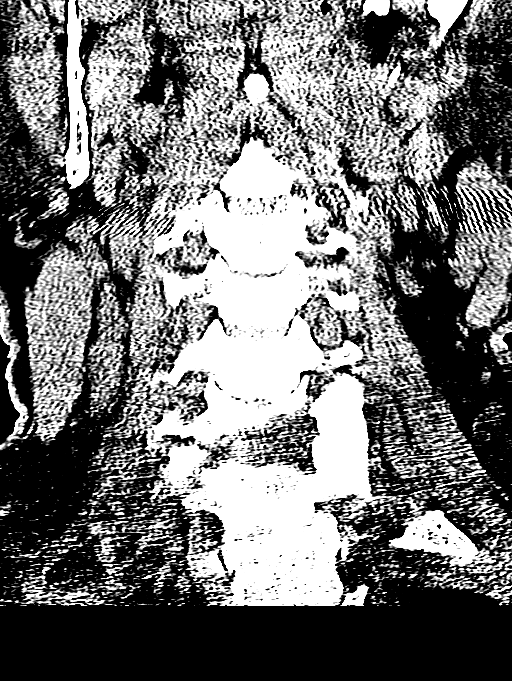
[im 28/42  brain]
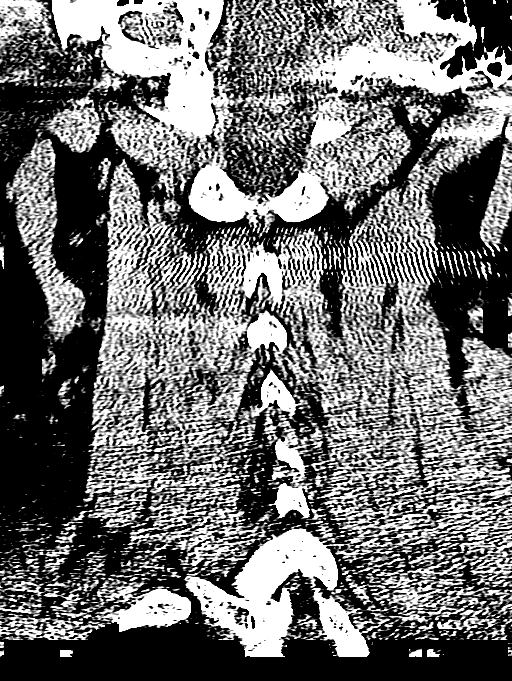

[Series 14: sagittal 3mm x (id) spacing · sagittal · 0.35mm/px · 1 of 46 slices shown]
[im 23/46  brain]
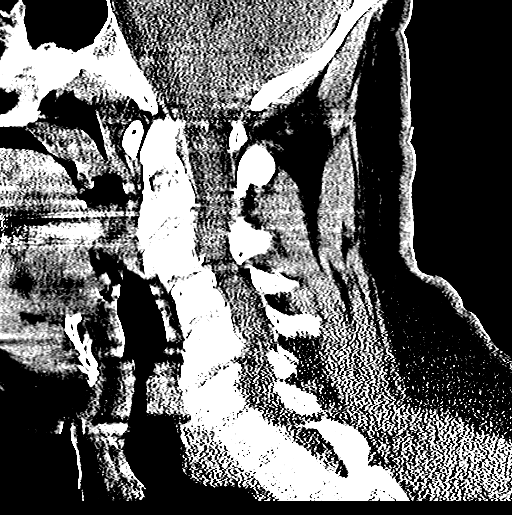

[15 of 47 positions shown; findings below may reference images not displayed]

FINDINGS: CT HEAD FINDINGS

Brain: No evidence of acute infarction, hemorrhage, hydrocephalus,
extra-axial collection or mass lesion/mass effect.

Vascular: No hyperdense vessel or unexpected calcification.

Skull: Normal. Negative for fracture or focal lesion.

Sinuses/Orbits: There is diffuse mucoperiosteal thickening of
paranasal sinuses. No air-fluid levels. The mastoid air cells are
clear.

Other: None

CT CERVICAL SPINE FINDINGS

Evaluation of cervical spine is limited due to motion artifact. The
sagittal images are severely motion degraded.

Alignment: No definite acute subluxation. There is apparent reversal
of normal cervical lordosis which may be artifactual or positional
or due to muscle spasm.

Skull base and vertebrae: No definite acute fracture.

Soft tissues and spinal canal: No prevertebral fluid or swelling. No
visible canal hematoma.

Disc levels: No definite acute findings. Evaluation however is
limited due to motion artifact.

Upper chest: Negative.

Other: None
IMPRESSION: 1. No acute intracranial pathology.
2. No definite acute/traumatic cervical spine pathology.
